# Patient Record
Sex: Male | Born: 1965 | Race: White | Hispanic: No | Marital: Married | Smoking: Former smoker
Health system: Southern US, Community
[De-identification: ages and names within clinical notes are randomized; demographics above are authoritative.]

## PROBLEM LIST (undated history)

## (undated) DIAGNOSIS — M545 Low back pain, unspecified: Secondary | ICD-10-CM

## (undated) DIAGNOSIS — F419 Anxiety disorder, unspecified: Secondary | ICD-10-CM

## (undated) DIAGNOSIS — E291 Testicular hypofunction: Secondary | ICD-10-CM

## (undated) DIAGNOSIS — E785 Hyperlipidemia, unspecified: Secondary | ICD-10-CM

## (undated) DIAGNOSIS — I10 Essential (primary) hypertension: Secondary | ICD-10-CM

## (undated) DIAGNOSIS — K219 Gastro-esophageal reflux disease without esophagitis: Secondary | ICD-10-CM

## (undated) DIAGNOSIS — K449 Diaphragmatic hernia without obstruction or gangrene: Secondary | ICD-10-CM

## (undated) DIAGNOSIS — S129XXA Fracture of neck, unspecified, initial encounter: Secondary | ICD-10-CM

## (undated) DIAGNOSIS — G4700 Insomnia, unspecified: Secondary | ICD-10-CM

## (undated) DIAGNOSIS — F329 Major depressive disorder, single episode, unspecified: Secondary | ICD-10-CM

## (undated) DIAGNOSIS — F32A Depression, unspecified: Secondary | ICD-10-CM

## (undated) DIAGNOSIS — E119 Type 2 diabetes mellitus without complications: Secondary | ICD-10-CM

## (undated) HISTORY — DX: Testicular hypofunction: E29.1

## (undated) HISTORY — DX: Diaphragmatic hernia without obstruction or gangrene: K44.9

## (undated) HISTORY — DX: Insomnia, unspecified: G47.00

## (undated) HISTORY — DX: Hyperlipidemia, unspecified: E78.5

## (undated) HISTORY — DX: Essential (primary) hypertension: I10

## (undated) HISTORY — DX: Gastro-esophageal reflux disease without esophagitis: K21.9

## (undated) HISTORY — DX: Anxiety disorder, unspecified: F41.9

## (undated) HISTORY — DX: Low back pain, unspecified: M54.50

## (undated) HISTORY — DX: Major depressive disorder, single episode, unspecified: F32.9

## (undated) HISTORY — PX: WISDOM TOOTH EXTRACTION: SHX21

## (undated) HISTORY — PX: CYST EXCISION: SHX5701

## (undated) HISTORY — DX: Depression, unspecified: F32.A

## (undated) HISTORY — DX: Type 2 diabetes mellitus without complications: E11.9

## (undated) HISTORY — DX: Low back pain: M54.5

## (undated) HISTORY — DX: Fracture of neck, unspecified, initial encounter: S12.9XXA

---

## 2017-03-25 ENCOUNTER — Ambulatory Visit (INDEPENDENT_AMBULATORY_CARE_PROVIDER_SITE_OTHER): Payer: BLUE CROSS/BLUE SHIELD | Admitting: Psychology

## 2017-03-25 DIAGNOSIS — F4323 Adjustment disorder with mixed anxiety and depressed mood: Secondary | ICD-10-CM | POA: Diagnosis not present

## 2017-03-26 ENCOUNTER — Other Ambulatory Visit: Payer: Self-pay | Admitting: Internal Medicine

## 2017-03-26 DIAGNOSIS — R109 Unspecified abdominal pain: Secondary | ICD-10-CM

## 2017-03-26 DIAGNOSIS — R509 Fever, unspecified: Secondary | ICD-10-CM

## 2017-03-26 DIAGNOSIS — R630 Anorexia: Secondary | ICD-10-CM

## 2017-03-27 ENCOUNTER — Ambulatory Visit
Admission: RE | Admit: 2017-03-27 | Discharge: 2017-03-27 | Disposition: A | Discharge status: Home / Self Care | Payer: BLUE CROSS/BLUE SHIELD | Source: Ambulatory Visit | Attending: Internal Medicine | Admitting: Internal Medicine

## 2017-03-27 DIAGNOSIS — R630 Anorexia: Secondary | ICD-10-CM

## 2017-03-27 DIAGNOSIS — R109 Unspecified abdominal pain: Secondary | ICD-10-CM

## 2017-03-27 DIAGNOSIS — R509 Fever, unspecified: Secondary | ICD-10-CM

## 2017-03-27 MED ORDER — IOPAMIDOL (ISOVUE-300) INJECTION 61%
125.0000 mL | Freq: Once | INTRAVENOUS | Status: AC | PRN
  Administered 2017-03-27: 125 mL via INTRAVENOUS

## 2017-04-18 ENCOUNTER — Ambulatory Visit (INDEPENDENT_AMBULATORY_CARE_PROVIDER_SITE_OTHER): Payer: BLUE CROSS/BLUE SHIELD | Admitting: Psychology

## 2017-04-18 DIAGNOSIS — F4323 Adjustment disorder with mixed anxiety and depressed mood: Secondary | ICD-10-CM

## 2017-05-16 ENCOUNTER — Ambulatory Visit (INDEPENDENT_AMBULATORY_CARE_PROVIDER_SITE_OTHER): Payer: BLUE CROSS/BLUE SHIELD | Admitting: Psychology

## 2017-05-16 DIAGNOSIS — F4323 Adjustment disorder with mixed anxiety and depressed mood: Secondary | ICD-10-CM | POA: Diagnosis not present

## 2017-06-19 ENCOUNTER — Ambulatory Visit (INDEPENDENT_AMBULATORY_CARE_PROVIDER_SITE_OTHER): Payer: BLUE CROSS/BLUE SHIELD | Admitting: Psychology

## 2017-06-19 DIAGNOSIS — F4323 Adjustment disorder with mixed anxiety and depressed mood: Secondary | ICD-10-CM

## 2017-07-03 ENCOUNTER — Institutional Professional Consult (permissible substitution): Payer: Self-pay | Admitting: Neurology

## 2017-07-18 ENCOUNTER — Ambulatory Visit (INDEPENDENT_AMBULATORY_CARE_PROVIDER_SITE_OTHER): Payer: BLUE CROSS/BLUE SHIELD | Admitting: Psychology

## 2017-07-18 DIAGNOSIS — F4323 Adjustment disorder with mixed anxiety and depressed mood: Secondary | ICD-10-CM | POA: Diagnosis not present

## 2017-07-31 ENCOUNTER — Encounter: Payer: Self-pay | Admitting: Neurology

## 2017-07-31 ENCOUNTER — Ambulatory Visit (INDEPENDENT_AMBULATORY_CARE_PROVIDER_SITE_OTHER): Payer: BLUE CROSS/BLUE SHIELD | Admitting: Neurology

## 2017-07-31 VITALS — BP 152/83 | HR 79 | Ht 74.0 in | Wt 224.0 lb

## 2017-07-31 DIAGNOSIS — E1169 Type 2 diabetes mellitus with other specified complication: Secondary | ICD-10-CM

## 2017-07-31 DIAGNOSIS — E669 Obesity, unspecified: Secondary | ICD-10-CM | POA: Diagnosis not present

## 2017-07-31 DIAGNOSIS — R51 Headache: Secondary | ICD-10-CM

## 2017-07-31 DIAGNOSIS — R609 Edema, unspecified: Secondary | ICD-10-CM | POA: Diagnosis not present

## 2017-07-31 DIAGNOSIS — G4719 Other hypersomnia: Secondary | ICD-10-CM

## 2017-07-31 DIAGNOSIS — G478 Other sleep disorders: Secondary | ICD-10-CM

## 2017-07-31 DIAGNOSIS — G479 Sleep disorder, unspecified: Secondary | ICD-10-CM

## 2017-07-31 DIAGNOSIS — R519 Headache, unspecified: Secondary | ICD-10-CM

## 2017-07-31 NOTE — Progress Notes (Signed)
Subjective:    Patient ID: Matthew Erickson. is a 51 y.o. male.  HPI     Matthew Foley, MD, PhD [facility] [address] [address] [place], [place] [address]  Dear Dr. Eloise Harman,   I saw your patient, Matthew Erickson, upon your kind request in my neurologic clinic today for initial consultation of his sleep disturbance, including difficulty with sleep maintenance.  The patient is unaccompanied today. As you know, Matthew Erickson is a 51 year old right-handed gentleman with an underlying medical history of diabetes, hypertension, low back pain, remote history of smoking and overweight state, who reports sleep disruption, daytime sleepiness, morning headaches.  He has difficulty shutting off his mind at night. He is a Product/process development scientist. His Epworth sleepiness score is 11 out of 24, fatigue score is 35 out of 63. He has nocturia occasionally, better since he has been on insulin. He has had morning headaches. He has been diabetic for about 5 years, on insulin for the past year. Diabetes control was suboptimal in the past, A1c is getting better, last A1c in July was 7.7. He quit smoking in 2001. He drinks alcohol infrequently in the form of beer, 2-4 per month, he does drink quite a bit of caffeine in the form of tea which is half and half, suite and unsweet, 4-6 glasses per day. He has 3 children. He lives with his wife and 79 year old daughter. He works as a Radio broadcast assistant. He has no family history of obstructive sleep apnea. He is not sure if he snores. He does not typically sleep in the same bedroom with his wife, often sleeps in his recliner in the family room with the TV on. I reviewed your office note from 06/20/2017, which you kindly included. Of note, some 6 months ago or so he tried Ambien 5 mg strength once and had daytime grogginess from it and has not tried it since then.  His Past Medical History Is Significant For: Past Medical History:  Diagnosis Date  . Anxiety and  depression   . Cervical spine fracture (HCC)   . Diabetes mellitus without complication (HCC)   . Hiatal hernia with gastroesophageal reflux   . Hyperlipidemia   . Hypertension   . Hypogonadism in male   . Insomnia   . Low back pain     His Past Surgical History Is Significant For: Past Surgical History:  Procedure Laterality Date  . CYST EXCISION    . WISDOM TOOTH EXTRACTION      His Family History Is Significant For: Family History  Problem Relation Age of Onset  . Diabetes Mother   . CAD Mother   . Diabetes Father   . Schizophrenia Father     His Social History Is Significant For: Social History   Social History  . Marital status: Married    Spouse name: N/A  . Number of children: N/A  . Years of education: N/A   Social History Main Topics  . Smoking status: Former Smoker    Quit date: 09/25/2000  . Smokeless tobacco: Never Used  . Alcohol use Yes  . Drug use: No  . Sexual activity: Not Asked   Other Topics Concern  . None   Social History Narrative   Drinks tea 4-6 glasses per day.    His Allergies Are:  Allergies  Allergen Reactions  . Codeine   :   His Current Medications Are:  Outpatient Encounter Prescriptions as of 07/31/2017  Medication Sig  . Ascorbic  Acid (VITAMIN C) 100 MG tablet Take 100 mg by mouth daily.  Marland Kitchen aspirin EC 81 MG tablet Take 81 mg by mouth daily.  . canagliflozin (INVOKANA) 100 MG TABS tablet Take 100 mg by mouth daily before breakfast.  . Cholecalciferol (VITAMIN D-3) 1000 units CAPS Take 1,000 Units by mouth.  . Cinnamon 500 MG TABS Take by mouth.  Marland Kitchen glimepiride (AMARYL) 4 MG tablet Take 4 mg by mouth daily with breakfast.  . insulin NPH Human (HUMULIN N,NOVOLIN N) 100 UNIT/ML injection Inject into the skin 2 (two) times daily before a meal. 10 units in am and 14 units in pm  . insulin regular (NOVOLIN R,HUMULIN R) 100 units/mL injection Inject 12 Units into the skin every evening.  Marland Kitchen losartan (COZAAR) 100 MG tablet Take  100 mg by mouth daily.  Marland Kitchen lovastatin (MEVACOR) 20 MG tablet Take 20 mg by mouth at bedtime.  . meloxicam (MOBIC) 15 MG tablet Take 15 mg by mouth daily as needed for pain.  . metFORMIN (GLUCOPHAGE-XR) 500 MG 24 hr tablet Take 500 mg by mouth 2 (two) times daily.  . nebivolol (BYSTOLIC) 5 MG tablet Take 5 mg by mouth daily.  Marland Kitchen testosterone cypionate (DEPOTESTOTERONE CYPIONATE) 100 MG/ML injection Inject 200 mg into the muscle every 21 ( twenty-one) days. For IM use only  . [DISCONTINUED] amLODipine (NORVASC) 10 MG tablet Take 10 mg by mouth daily.  . [DISCONTINUED] propranolol (INDERAL) 10 MG tablet Take 10 mg by mouth daily as needed.  . [DISCONTINUED] tamsulosin (FLOMAX) 0.4 MG CAPS capsule Take 0.4 mg by mouth at bedtime.   No facility-administered encounter medications on file as of 07/31/2017.   :  Review of Systems:  Out of a complete 14 point review of systems, all are reviewed and negative with the exception of these symptoms as listed below:  Review of Systems  Neurological:       Pt presents today to discuss his sleep. Pt does not sleep well. Pt has never had a sleep study and does not know if he snores.  Epworth Sleepiness Scale 0= would never doze 1= slight chance of dozing 2= moderate chance of dozing 3= high chance of dozing  Sitting and reading: 2 Watching TV: 3 Sitting inactive in a public place (ex. Theater or meeting): 1 As a passenger in a car for an hour without a break: 1 Lying down to rest in the afternoon: 3 Sitting and talking to someone: 0 Sitting quietly after lunch (no alcohol): 1 In a car, while stopped in traffic: 0 Total: 11     Objective:  Neurological Exam  Physical Exam Physical Examination:   Vitals:   07/31/17 1608  BP: (!) 152/83  Pulse: 79    General Examination: The patient is a very pleasant 51 y.o. male in no acute distress. He appears well-developed and well-nourished and well groomed.   HEENT: Normocephalic, atraumatic, pupils  are equal, round and reactive to light and accommodation. Funduscopic exam is normal with sharp disc margins noted. Extraocular tracking is good without limitation to gaze excursion or nystagmus noted. Normal smooth pursuit is noted. Hearing is grossly intact, reports hearing loss on R. Face is symmetric with normal facial animation and normal facial sensation. Speech is clear with no dysarthria noted. There is no hypophonia. There is no lip, neck/head, jaw or voice tremor. Neck is supple with full range of passive and active motion. There are no carotid bruits on auscultation. Oropharynx exam reveals: mild mouth dryness, adequate dental  hygiene and moderate airway crowding, due to smaller airway entry, redundant soft palate, large uvula and tonsils of 1-2+ bilaterally. Mallampati is class II. Neck circumference is 16-3/4 inches. Tongue protrudes centrally and palate elevates symmetrically.   Chest: Clear to auscultation without wheezing, rhonchi or crackles noted.  Heart: S1+S2+0, regular and normal without murmurs, rubs or gallops noted.   Abdomen: Soft, non-tender and non-distended with normal bowel sounds appreciated on auscultation.  Extremities: There is trace pitting edema in the distal lower extremities bilaterally. Pedal pulses are intact.  Skin: Warm and dry without trophic changes noted. There are no varicose veins.  Musculoskeletal: exam reveals no obvious joint deformities, tenderness or joint swelling or erythema.   Neurologically:  Mental status: The patient is awake, alert and oriented in all 4 spheres. His immediate and remote memory, attention, language skills and fund of knowledge are appropriate. There is no evidence of aphasia, agnosia, apraxia or anomia. Speech is clear with normal prosody and enunciation. Thought process is linear. Mood is normal and affect is normal.  Cranial nerves II - XII are as described above under HEENT exam. In addition: shoulder shrug is normal with  equal shoulder height noted. Motor exam: Normal bulk, strength and tone is noted. There is no drift, tremor or rebound. Romberg is negative. Reflexes are 1+ in the upper extremities, trace in both knees and ankles. Fine motor skills and coordination: intact with normal finger taps, normal hand movements, normal rapid alternating patting, normal foot taps and normal foot agility.  Cerebellar testing: No dysmetria or intention tremor on finger to nose testing. Heel to shin is unremarkable bilaterally. There is no truncal or gait ataxia.  Sensory exam: intact to light touch in the upper and lower extremities.  Gait, station and balance: He stands easily. No veering to one side is noted. No leaning to one side is noted. Posture is age-appropriate and stance is narrow based. Gait shows normal stride length and normal pace. No problems turning are noted. Tandem walk is unremarkable.           Assessment and Plan:   In summary, Matthew Schellenberg. is a very pleasant 51 y.o.-year old male with an underlying medical history of diabetes, hypertension, low back pain, remote history of smoking and overweight state, who presents for evaluation of his sleep difficulty including sleep maintenance problems, morning headaches, nonrestorative sleep and daytime somnolence. While not telltale in his symptoms, his physical exam and history may suggest underlying obstructive sleep apnea. Of note, he does not endorse telltale restless leg symptoms but is a restless sleeper.  I had a long chat with the patient about my findings and the diagnosis of OSA, its prognosis and treatment options. We talked about medical treatments, surgical interventions and non-pharmacological approaches. I explained in particular the risks and ramifications of untreated moderate to severe OSA, especially with respect to developing cardiovascular disease down the Road, including congestive heart failure, difficult to treat hypertension, cardiac arrhythmias,  or stroke. Even type 2 diabetes has, in part, been linked to untreated OSA. Symptoms of untreated OSA include daytime sleepiness, memory problems, mood irritability and mood disorder such as depression and anxiety, lack of energy, as well as recurrent headaches, especially morning headaches. We talked about trying to maintain a healthy lifestyle in general, as well as the importance of weight control.  I recommended the following at this time: sleep study with potential positive airway pressure titration. (We will score hypopneas at 3%).   I explained the  sleep test procedure to the patient and also outlined possible treatment options of OSA, including the use of CPAP. He indicated that he would be willing to try CPAP if the need arises. I explained the importance of being compliant with PAP treatment, not only for insurance purposes but primarily to improve His symptoms, and for the patient's long term health benefit, including to reduce His cardiovascular risks. I answered all his questions today and the patient was in agreement. I would like to see him back after the sleep study is completed and encouraged him to call with any interim questions, concerns, problems or updates.   Thank you very much for allowing me to participate in the care of this nice patient. If I can be of any further assistance to you please do not hesitate to call me at [phone].  Sincerely,   Matthew FoleySaima Julien Berryman, MD, PhD

## 2017-07-31 NOTE — Patient Instructions (Signed)

## 2017-08-18 ENCOUNTER — Telehealth: Payer: Self-pay | Admitting: Neurology

## 2017-08-18 ENCOUNTER — Ambulatory Visit (INDEPENDENT_AMBULATORY_CARE_PROVIDER_SITE_OTHER): Payer: BLUE CROSS/BLUE SHIELD | Admitting: Psychology

## 2017-08-18 DIAGNOSIS — F4323 Adjustment disorder with mixed anxiety and depressed mood: Secondary | ICD-10-CM

## 2017-08-18 DIAGNOSIS — G4719 Other hypersomnia: Secondary | ICD-10-CM

## 2017-08-18 NOTE — Telephone Encounter (Signed)
[employer] denied Split sleep and approved HST.  Can I get an order for HST? °

## 2017-09-03 ENCOUNTER — Ambulatory Visit (INDEPENDENT_AMBULATORY_CARE_PROVIDER_SITE_OTHER): Payer: BLUE CROSS/BLUE SHIELD | Admitting: Neurology

## 2017-09-03 DIAGNOSIS — G4719 Other hypersomnia: Secondary | ICD-10-CM

## 2017-09-03 DIAGNOSIS — G4733 Obstructive sleep apnea (adult) (pediatric): Secondary | ICD-10-CM

## 2017-09-08 NOTE — Progress Notes (Signed)
Patient referred by Dr. Eloise Harman, seen by me on 07/31/17, HST on 09/03/17.   Please call and notify the patient that the recent home sleep test did not show any significant obstructive sleep apnea.  Please remind patient to try to maintain good sleep hygiene, which means: Keep a regular sleep and wake schedule and make enough time for sleep (7 1/2 to 8 1/2 hours for the average adult), try not to exercise or have a meal within 2 hours of your bedtime, try to keep your bedroom conducive for sleep, that is, cool and dark, without light distractors such as an illuminated alarm clock, and refrain from watching TV right before sleep or in the middle of the night and do not keep the TV or radio on during the night. If a nightlight is used, have it away from the visual field. Also, try not to use or play on electronic devices at bedtime, such as your cell phone, tablet PC or laptop. If you like to read at bedtime on an electronic device, try to dim the background light as much as possible. Do not eat in the middle of the night. Keep pets away from the bedroom environment. For stress relief, try meditation, deep breathing exercises (there are many books and CDs available), a white noise machine or fan can help to diffuse other noise distractors, such as traffic noise. Do not drink alcohol before bedtime, as it can disturb sleep and cause middle of the night awakenings. Never mix alcohol and sedating medications! Avoid narcotic pain medication close to bedtime, as opioids/narcotics can suppress breathing drive and breathing effort.   Patient can follow up with his PCP.   A copy of the report will be sent to the patient, the PCP and referring MD, if other than PCP.  Once you have spoken to patient, you can close this encounter.   Thanks,  Huston Foley, MD, PhD [facility])

## 2017-09-08 NOTE — Procedures (Signed)
[facility] [address] [place], [place] [address] NAME: Matthew Erickson                                                         DOB: [birth year] MEDICAL RECORD NUMBER   [id number]                                    DOS: 09/03/17 REFERRING PHYSICIAN: Jarome Matin, MD STUDY PERFORMED: Home Sleep Test HISTORY: 51 year old man with a history of diabetes, hypertension, low back pain, remote history of smoking and overweight state, who reports sleep disruption, daytime sleepiness, morning headaches.  STUDY RESULTS: Total Recording: 8 Hours, 12 Minutes Total Apnea/Hypopnea Index (AHI):   3.8/Hour Average Oxygen Saturation:     94% Lowest Oxygen Saturation:      88%  Time Oxygen Saturation Below 88%: 0 min Average Heart Rate:   58 BPM IMPRESSION: Sleep disturbance RECOMMENDATION: This home sleep test does not demonstrate any significant obstructive or central sleep disordered breathing. Other causes of the patient's symptoms, including circadian rhythm disturbances, an underlying mood disorder, medication effect and/or an underlying medical problem cannot be ruled out based on this test. Clinical correlation is recommended. The patient should be cautioned not to drive, work at heights, or operate dangerous or heavy equipment when tired or sleepy. Review and reiteration of good sleep hygiene measures should be pursued with any patient. The patient and his referring provider will be notified of the test results; a follow up appointment will be made as necessary.  I certify that I have reviewed the raw data recording prior to the issuance of this report in accordance with the standards of the American Academy of Sleep Medicine (AASM). Huston Foley, MD, PhD [facility]) Diplomat, ABPN (Neurology and Sleep)

## 2017-09-09 ENCOUNTER — Telehealth: Payer: Self-pay

## 2017-09-09 NOTE — Telephone Encounter (Signed)
I called pt to discuss. No answer, left a message asking him to call me back. 

## 2017-09-09 NOTE — Telephone Encounter (Signed)
-----   Message from Huston Foley, MD sent at 09/08/2017  2:07 PM EDT ----- Patient referred by Dr. Eloise Harman, seen by me on 07/31/17, HST on 09/03/17.   Please call and notify the patient that the recent home sleep test did not show any significant obstructive sleep apnea.  Please remind patient to try to maintain good sleep hygiene, which means: Keep a regular sleep and wake schedule and make enough time for sleep (7 1/2 to 8 1/2 hours for the average adult), try not to exercise or have a meal within 2 hours of your bedtime, try to keep your bedroom conducive for sleep, that is, cool and dark, without light distractors such as an illuminated alarm clock, and refrain from watching TV right before sleep or in the middle of the night and do not keep the TV or radio on during the night. If a nightlight is used, have it away from the visual field. Also, try not to use or play on electronic devices at bedtime, such as your cell phone, tablet PC or laptop. If you like to read at bedtime on an electronic device, try to dim the background light as much as possible. Do not eat in the middle of the night. Keep pets away from the bedroom environment. For stress relief, try meditation, deep breathing exercises (there are many books and CDs available), a white noise machine or fan can help to diffuse other noise distractors, such as traffic noise. Do not drink alcohol before bedtime, as it can disturb sleep and cause middle of the night awakenings. Never mix alcohol and sedating medications! Avoid narcotic pain medication close to bedtime, as opioids/narcotics can suppress breathing drive and breathing effort.   Patient can follow up with his PCP.   A copy of the report will be sent to the patient, the PCP and referring MD, if other than PCP.  Once you have spoken to patient, you can close this encounter.   Thanks,  Huston Foley, MD, PhD [facility])

## 2017-09-10 NOTE — Telephone Encounter (Signed)
Pt returned RN's call °

## 2017-09-10 NOTE — Telephone Encounter (Signed)
I called pt. I advised pt that Dr. Frances FurbishAthar reviewed pt's sleep study and found that pt did not show any significant osa. Dr. Frances FurbishAthar recommends that pt follow up with Dr. Eloise HarmanPaterson. I reviewed sleep hygiene recommendations with the pt, including trying to keep a regular sleep wake schedule, avoiding electronics in the bedroom, keeping the bedroom cool, dark, and quiet, and avoiding eating or exercising within 2 hours of bedtime as well as eating in the middle of the night. I advised pt to keep pets away from the bedtime. I discussed with pt the importance of stress relief and to try meditation, deep breathing exercises, and/or a white noise machine or fan to diffuse other noise distracters. I advised pt to not drink alcohol before bedtime and to never mix alcohol and sedating medications. Pt was advised to avoid narcotic pain medication close to bedtime. I advised pt that a copy of these sleep study results will be sent to Dr. Eloise HarmanPaterson. Pt verbalized understanding of results. Pt had no questions at this time but was encouraged to call back if questions arise.

## 2017-09-22 ENCOUNTER — Ambulatory Visit: Payer: BLUE CROSS/BLUE SHIELD | Admitting: Psychology

## 2017-09-29 DIAGNOSIS — R972 Elevated prostate specific antigen [PSA]: Secondary | ICD-10-CM | POA: Diagnosis not present

## 2017-09-29 DIAGNOSIS — E1165 Type 2 diabetes mellitus with hyperglycemia: Secondary | ICD-10-CM | POA: Diagnosis not present

## 2017-09-29 DIAGNOSIS — E298 Other testicular dysfunction: Secondary | ICD-10-CM | POA: Diagnosis not present

## 2017-10-31 ENCOUNTER — Ambulatory Visit (INDEPENDENT_AMBULATORY_CARE_PROVIDER_SITE_OTHER): Payer: 59 | Admitting: Psychology

## 2017-10-31 DIAGNOSIS — F4323 Adjustment disorder with mixed anxiety and depressed mood: Secondary | ICD-10-CM

## 2017-11-05 DIAGNOSIS — M545 Low back pain: Secondary | ICD-10-CM | POA: Diagnosis not present

## 2017-11-05 DIAGNOSIS — M25552 Pain in left hip: Secondary | ICD-10-CM | POA: Diagnosis not present

## 2017-11-05 DIAGNOSIS — E1165 Type 2 diabetes mellitus with hyperglycemia: Secondary | ICD-10-CM | POA: Diagnosis not present

## 2017-11-06 DIAGNOSIS — M5432 Sciatica, left side: Secondary | ICD-10-CM | POA: Diagnosis not present

## 2017-11-06 DIAGNOSIS — M9903 Segmental and somatic dysfunction of lumbar region: Secondary | ICD-10-CM | POA: Diagnosis not present

## 2017-11-10 DIAGNOSIS — M5432 Sciatica, left side: Secondary | ICD-10-CM | POA: Diagnosis not present

## 2017-11-10 DIAGNOSIS — M9903 Segmental and somatic dysfunction of lumbar region: Secondary | ICD-10-CM | POA: Diagnosis not present

## 2017-11-11 DIAGNOSIS — M5432 Sciatica, left side: Secondary | ICD-10-CM | POA: Diagnosis not present

## 2017-11-11 DIAGNOSIS — M9903 Segmental and somatic dysfunction of lumbar region: Secondary | ICD-10-CM | POA: Diagnosis not present

## 2017-11-12 DIAGNOSIS — M5432 Sciatica, left side: Secondary | ICD-10-CM | POA: Diagnosis not present

## 2017-11-12 DIAGNOSIS — M9903 Segmental and somatic dysfunction of lumbar region: Secondary | ICD-10-CM | POA: Diagnosis not present

## 2017-11-13 DIAGNOSIS — M9903 Segmental and somatic dysfunction of lumbar region: Secondary | ICD-10-CM | POA: Diagnosis not present

## 2017-11-13 DIAGNOSIS — M5432 Sciatica, left side: Secondary | ICD-10-CM | POA: Diagnosis not present

## 2017-11-20 DIAGNOSIS — M9903 Segmental and somatic dysfunction of lumbar region: Secondary | ICD-10-CM | POA: Diagnosis not present

## 2017-11-20 DIAGNOSIS — M5432 Sciatica, left side: Secondary | ICD-10-CM | POA: Diagnosis not present

## 2017-11-24 DIAGNOSIS — M5432 Sciatica, left side: Secondary | ICD-10-CM | POA: Diagnosis not present

## 2017-11-24 DIAGNOSIS — M9903 Segmental and somatic dysfunction of lumbar region: Secondary | ICD-10-CM | POA: Diagnosis not present

## 2017-11-26 DIAGNOSIS — M5432 Sciatica, left side: Secondary | ICD-10-CM | POA: Diagnosis not present

## 2017-11-26 DIAGNOSIS — M9903 Segmental and somatic dysfunction of lumbar region: Secondary | ICD-10-CM | POA: Diagnosis not present

## 2017-11-27 DIAGNOSIS — M5432 Sciatica, left side: Secondary | ICD-10-CM | POA: Diagnosis not present

## 2017-11-27 DIAGNOSIS — M9903 Segmental and somatic dysfunction of lumbar region: Secondary | ICD-10-CM | POA: Diagnosis not present

## 2017-12-01 DIAGNOSIS — M5432 Sciatica, left side: Secondary | ICD-10-CM | POA: Diagnosis not present

## 2017-12-01 DIAGNOSIS — M9903 Segmental and somatic dysfunction of lumbar region: Secondary | ICD-10-CM | POA: Diagnosis not present

## 2017-12-04 DIAGNOSIS — M9903 Segmental and somatic dysfunction of lumbar region: Secondary | ICD-10-CM | POA: Diagnosis not present

## 2017-12-04 DIAGNOSIS — M5432 Sciatica, left side: Secondary | ICD-10-CM | POA: Diagnosis not present

## 2017-12-09 DIAGNOSIS — M5432 Sciatica, left side: Secondary | ICD-10-CM | POA: Diagnosis not present

## 2017-12-09 DIAGNOSIS — M9903 Segmental and somatic dysfunction of lumbar region: Secondary | ICD-10-CM | POA: Diagnosis not present

## 2017-12-10 DIAGNOSIS — M5432 Sciatica, left side: Secondary | ICD-10-CM | POA: Diagnosis not present

## 2017-12-10 DIAGNOSIS — M9903 Segmental and somatic dysfunction of lumbar region: Secondary | ICD-10-CM | POA: Diagnosis not present

## 2017-12-11 ENCOUNTER — Other Ambulatory Visit: Payer: Self-pay | Admitting: Internal Medicine

## 2017-12-11 DIAGNOSIS — M545 Low back pain: Secondary | ICD-10-CM

## 2017-12-12 ENCOUNTER — Ambulatory Visit: Payer: BLUE CROSS/BLUE SHIELD | Admitting: Psychology

## 2017-12-15 DIAGNOSIS — M9903 Segmental and somatic dysfunction of lumbar region: Secondary | ICD-10-CM | POA: Diagnosis not present

## 2017-12-15 DIAGNOSIS — M5432 Sciatica, left side: Secondary | ICD-10-CM | POA: Diagnosis not present

## 2017-12-17 DIAGNOSIS — M9903 Segmental and somatic dysfunction of lumbar region: Secondary | ICD-10-CM | POA: Diagnosis not present

## 2017-12-17 DIAGNOSIS — M5432 Sciatica, left side: Secondary | ICD-10-CM | POA: Diagnosis not present

## 2017-12-20 ENCOUNTER — Other Ambulatory Visit: Payer: Self-pay

## 2017-12-22 DIAGNOSIS — M9903 Segmental and somatic dysfunction of lumbar region: Secondary | ICD-10-CM | POA: Diagnosis not present

## 2017-12-22 DIAGNOSIS — M5432 Sciatica, left side: Secondary | ICD-10-CM | POA: Diagnosis not present

## 2017-12-24 DIAGNOSIS — M5432 Sciatica, left side: Secondary | ICD-10-CM | POA: Diagnosis not present

## 2017-12-24 DIAGNOSIS — M9903 Segmental and somatic dysfunction of lumbar region: Secondary | ICD-10-CM | POA: Diagnosis not present

## 2017-12-29 DIAGNOSIS — M9903 Segmental and somatic dysfunction of lumbar region: Secondary | ICD-10-CM | POA: Diagnosis not present

## 2017-12-29 DIAGNOSIS — M5432 Sciatica, left side: Secondary | ICD-10-CM | POA: Diagnosis not present

## 2017-12-30 DIAGNOSIS — E1165 Type 2 diabetes mellitus with hyperglycemia: Secondary | ICD-10-CM | POA: Diagnosis not present

## 2017-12-30 DIAGNOSIS — E298 Other testicular dysfunction: Secondary | ICD-10-CM | POA: Diagnosis not present

## 2017-12-31 DIAGNOSIS — M9903 Segmental and somatic dysfunction of lumbar region: Secondary | ICD-10-CM | POA: Diagnosis not present

## 2017-12-31 DIAGNOSIS — M5432 Sciatica, left side: Secondary | ICD-10-CM | POA: Diagnosis not present

## 2018-01-05 DIAGNOSIS — M9903 Segmental and somatic dysfunction of lumbar region: Secondary | ICD-10-CM | POA: Diagnosis not present

## 2018-01-05 DIAGNOSIS — M5432 Sciatica, left side: Secondary | ICD-10-CM | POA: Diagnosis not present

## 2018-01-06 ENCOUNTER — Ambulatory Visit (INDEPENDENT_AMBULATORY_CARE_PROVIDER_SITE_OTHER): Payer: 59 | Admitting: Physical Medicine and Rehabilitation

## 2018-01-06 ENCOUNTER — Encounter (INDEPENDENT_AMBULATORY_CARE_PROVIDER_SITE_OTHER): Payer: Self-pay | Admitting: Physical Medicine and Rehabilitation

## 2018-01-06 VITALS — BP 160/94 | HR 68 | Temp 97.9°F

## 2018-01-06 DIAGNOSIS — G8929 Other chronic pain: Secondary | ICD-10-CM | POA: Diagnosis not present

## 2018-01-06 DIAGNOSIS — M47816 Spondylosis without myelopathy or radiculopathy, lumbar region: Secondary | ICD-10-CM | POA: Diagnosis not present

## 2018-01-06 DIAGNOSIS — S39012D Strain of muscle, fascia and tendon of lower back, subsequent encounter: Secondary | ICD-10-CM | POA: Diagnosis not present

## 2018-01-06 DIAGNOSIS — M5442 Lumbago with sciatica, left side: Secondary | ICD-10-CM | POA: Diagnosis not present

## 2018-01-06 NOTE — Progress Notes (Signed)
Matthew Erickson. - 52 y.o. male MRN [id number]  Date of birth: [birth year]  Office Visit Note: Visit Date: 01/06/2018 PCP: [facility]erred by: [facility]  Subjective: Chief Complaint  Patient presents with  . Lower Back - Pain  . Left Leg - Pain   HPI: Matthew Erickson is a 52 year old gentleman who comes in today at the request of Dr. Jarome Matin for evaluation management of worsening low back pain and left radicular type leg pain.  Patient reports that he slipped in the snow in December of last year began having a lot of pain that originated in the left lower back and buttock region.  He reports initial use of anti-inflammatory rest and ice with no real relief.  He actually saw his chiropractor Dr. Manson Passey in [place].  He reports the chiropractor has helped him at least start walking straight to be more upright but he still has a considerable amount of pain on the left side.  He reports not liking to take medication and does not want to take pain medication but does take 1 Vicodin at night and it does help him sleep.  He reports no pain on the right side.  He reports worsening symptoms with coughing sneezing and moving the wrong way particularly with rotation shifting.  He also reports wearing a back brace helps with not moving the right way and seems to help him walk upright.  He really states nothing alleviates the pain.  He has had lumbar spine x-rays to Dr. Jarold Motto.  We have the report of that which basically shows fairly normal anatomic alignment with mild scoliosis and some disc height loss at L4-5.  Unfortunately we do not have those to review.  He has not had an MRI of her previous back surgery.  He has had no prior injections.  He has had pain off and on for many years with an episode in 2012 with a similar sciatica type symptoms.  He denies any focal weakness or bowel or bladder issues.  He has had no specific night pain or unintended weight loss.  He  has had a shot of Depo-Medrol which only helped for a few days.  He is a diabetic and it did raise his blood sugar a little bit.  He reports his diabetes is in good control.  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    Review of Systems  Constitutional: Negative for chills, fever, malaise/fatigue and weight loss.  HENT: Negative for hearing loss and sinus pain.   Eyes: Negative for blurred vision, double vision and photophobia.  Respiratory: Negative for cough and shortness of breath.   Cardiovascular: Negative for chest pain, palpitations and leg swelling.  Gastrointestinal: Negative for abdominal pain, nausea and vomiting.  Genitourinary: Negative for flank pain.  Musculoskeletal: Positive for back pain. Negative for myalgias.       Left hip and leg pain  Skin: Negative for itching and rash.  Neurological: Negative for tremors, focal weakness and weakness.  Endo/Heme/Allergies: Negative.   Psychiatric/Behavioral: Negative for depression.  All other systems reviewed and are negative.  Otherwise per HPI.  Assessment & Plan: Visit Diagnoses:  1. Strain of lumbar region, subsequent encounter   2. Chronic left-sided low back pain with left-sided sciatica   3. Spondylosis without myelopathy or radiculopathy, lumbar region     Plan: Findings:  History of chronic intermittent low back  pain and left radicular pain with last big episode of this in 2012 but now with increasing symptoms since December.  He really is failed conservative care with some anti-inflammatory medication as well as opioid medication chiropractic care.  His symptoms are consistent probably with small disc herniation or annular tear.  He is getting some radicular pain on the left and more of an L5 distribution.  At this point, he does have meloxicam at home that he was taking for occasional tension headache.  He was not taking that for his  lower back but was taking an intermittent mixture of as needed Naprosyn and ibuprofen.  I want him to take the meloxicam daily at least for a short while.  I also want him to schedule an epidural injection which would be at L5-S1 injection on the left.  We discussed this at length.  We discussed the natural history of strain of the lumbar region and disc herniations.  He has no red flag symptoms and does not really need an MRI currently.  He does not get any better with epidural injection I would suggest an MRI at that point.  Continue with his chiropractic care.  We talked about neural flossing.    Meds & Orders: No orders of the defined types were placed in this encounter.  No orders of the defined types were placed in this encounter.   Follow-up: Return for Left L5-S1 intralaminar epidural steroid injection.   Procedures: No procedures performed  No notes on file   Clinical History: No specialty comments available.  He reports that he quit smoking about 17 years ago. he has never used smokeless tobacco. No results for input(s): HGBA1C, LABURIC in the last 8760 hours.  Objective:  VS:  HT:    WT:   BMI:     BP:(!) 160/94  HR:68bpm  TEMP:97.9 F (36.6 C)(Oral)  RESP:97 % Physical Exam  Constitutional: He is oriented to person, place, and time. He appears well-developed and well-nourished. No distress.  HENT:  Head: Normocephalic and atraumatic.  Nose: Nose normal.  Mouth/Throat: Oropharynx is clear and moist.  Eyes: Conjunctivae are normal. Pupils are equal, round, and reactive to light.  Neck: Normal range of motion. Neck supple. No JVD present.  Cardiovascular: Regular rhythm and intact distal pulses.  Pulmonary/Chest: Effort normal and breath sounds normal.  Abdominal: Soft. He exhibits no distension. There is no guarding.  Musculoskeletal: He exhibits no deformity.  Patient is somewhat slow to arise from a seated does ambulate with an antalgic gait to the left.  He has no  pain with hip rotation internal or external.  He has some mild pain over the left greater trochanter more than the right.  He has an essentially positive slump test on the left but also just a lot of pain with flexion of the lumbar spine.  He has a lot of paraspinal tenderness and spasm.  He has good strength in the lower extremities without clonus.  Neurological: He is alert and oriented to person, place, and time. He exhibits normal muscle tone. Coordination normal.  Skin: Skin is warm. No rash noted.  Psychiatric: He has a normal mood and affect. His behavior is normal.  Nursing note and vitals reviewed.   Ortho Exam Imaging: No results found.  Past Medical/Family/Surgical/Social History: Medications & Allergies reviewed per EMR There are no active problems to display for this patient.  Past Medical History:  Diagnosis Date  . Anxiety and depression   . Cervical spine  fracture (HCC)   . Diabetes mellitus without complication (HCC)   . Hiatal hernia with gastroesophageal reflux   . Hyperlipidemia   . Hypertension   . Hypogonadism in male   . Insomnia   . Low back pain    Family History  Problem Relation Age of Onset  . Diabetes Mother   . CAD Mother   . Diabetes Father   . Schizophrenia Father    Past Surgical History:  Procedure Laterality Date  . CYST EXCISION    . WISDOM TOOTH EXTRACTION     Social History   Occupational History  . Not on file  Tobacco Use  . Smoking status: Former Smoker    Last attempt to quit: 09/25/2000    Years since quitting: 17.2  . Smokeless tobacco: Never Used  Substance and Sexual Activity  . Alcohol use: Yes  . Drug use: No  . Sexual activity: Not on file

## 2018-01-06 NOTE — Progress Notes (Deleted)
Pt states soreness in lower back mostly on the left side that radiates to the left leg. Pt states he slipped on the snow in December 2018. Pt states the pain has been going on for 2 months. Pt states coughing, sneezing, and moving the wrong way makes the pain worse, pt states nothing helps pain. Pt states using a back brace helps with not moving the wrong way.

## 2018-01-07 ENCOUNTER — Encounter (INDEPENDENT_AMBULATORY_CARE_PROVIDER_SITE_OTHER): Payer: Self-pay | Admitting: Physical Medicine and Rehabilitation

## 2018-01-14 DIAGNOSIS — M5432 Sciatica, left side: Secondary | ICD-10-CM | POA: Diagnosis not present

## 2018-01-14 DIAGNOSIS — M9903 Segmental and somatic dysfunction of lumbar region: Secondary | ICD-10-CM | POA: Diagnosis not present

## 2018-01-20 DIAGNOSIS — M9903 Segmental and somatic dysfunction of lumbar region: Secondary | ICD-10-CM | POA: Diagnosis not present

## 2018-01-20 DIAGNOSIS — M5432 Sciatica, left side: Secondary | ICD-10-CM | POA: Diagnosis not present

## 2018-01-21 ENCOUNTER — Ambulatory Visit (INDEPENDENT_AMBULATORY_CARE_PROVIDER_SITE_OTHER): Payer: 59 | Admitting: Psychology

## 2018-01-21 DIAGNOSIS — F4323 Adjustment disorder with mixed anxiety and depressed mood: Secondary | ICD-10-CM

## 2018-01-22 ENCOUNTER — Ambulatory Visit (INDEPENDENT_AMBULATORY_CARE_PROVIDER_SITE_OTHER): Payer: Self-pay

## 2018-01-22 ENCOUNTER — Encounter (INDEPENDENT_AMBULATORY_CARE_PROVIDER_SITE_OTHER): Payer: Self-pay | Admitting: Physical Medicine and Rehabilitation

## 2018-01-22 ENCOUNTER — Ambulatory Visit (INDEPENDENT_AMBULATORY_CARE_PROVIDER_SITE_OTHER): Payer: 59 | Admitting: Physical Medicine and Rehabilitation

## 2018-01-22 VITALS — BP 134/99 | HR 77 | Temp 98.2°F

## 2018-01-22 DIAGNOSIS — M5416 Radiculopathy, lumbar region: Secondary | ICD-10-CM

## 2018-01-22 MED ORDER — METHYLPREDNISOLONE ACETATE 80 MG/ML IJ SUSP
80.0000 mg | Freq: Once | INTRAMUSCULAR | Status: AC
  Administered 2018-01-22: 80 mg

## 2018-01-22 NOTE — Progress Notes (Deleted)
Pt states pain in lower back that radiates down through the left leg. Pt states pain started in December 2018. Pt states pain has gotten worse and he has not lifted anything. Pt states using ice sometimes makes pain better. +Driver, -BT, -Dye Allergies.

## 2018-01-22 NOTE — Patient Instructions (Signed)

## 2018-01-27 NOTE — Progress Notes (Signed)
Matthew SohoJames Schaber Jr. - 52 y.o. male MRN [id number]  Date of birth: [birth year]  Office Visit Note: Visit Date: 01/22/2018 PCP: [facility] Referred by: [facility]  Subjective: No chief complaint on file.  HPI: Mr. Matthew FoleyBrady is a 52 year old gentleman who comes in today for planned left L5-S1 interlaminar epidural steroid injection.  Please see our prior evaluation and management note for further details and justification.    ROS Otherwise per HPI.  Assessment & Plan: Visit Diagnoses:  1. Lumbar radiculopathy     Plan: Findings:  Diagnostic and therapeutic left L5-S1 intralaminar epidural steroid injection.    Meds & Orders:  Meds ordered this encounter  Medications  . methylPREDNISolone acetate (DEPO-MEDROL) injection 80 mg    Orders Placed This Encounter  Procedures  . XR C-ARM NO REPORT  . Epidural Steroid injection    Follow-up: Return if symptoms worsen or fail to improve.   Procedures: No procedures performed  Lumbar Epidural Steroid Injection - Interlaminar Approach with Fluoroscopic Guidance  Patient: Matthew SohoJames Blas Jr.      Date of Birth: [birth year] MRN: [id number] PCP: [facility]      Visit Date: 01/22/2018   Universal Protocol:     Consent Given By: the patient  Position: PRONE  Additional Comments: Vital signs were monitored before and after the procedure. Patient was prepped and draped in the usual sterile fashion. The correct patient, procedure, and site was verified.   Injection Procedure Details:  Procedure Site One Meds Administered:  Meds ordered this encounter  Medications  . methylPREDNISolone acetate (DEPO-MEDROL) injection 80 mg     Laterality: Left  Location/Site:  L5-S1  Needle size: 20 G  Needle type: Tuohy  Needle Placement: Paramedian epidural  Findings:   -Comments: Excellent flow of contrast into the epidural space.  Procedure Details: Using a paramedian approach  from the side mentioned above, the region overlying the inferior lamina was localized under fluoroscopic visualization and the soft tissues overlying this structure were infiltrated with 4 ml. of 1% Lidocaine without Epinephrine. The Tuohy needle was inserted into the epidural space using a paramedian approach.   The epidural space was localized using loss of resistance along with lateral and bi-planar fluoroscopic views.  After negative aspirate for air, blood, and CSF, a 2 ml. volume of Isovue-250 was injected into the epidural space and the flow of contrast was observed. Radiographs were obtained for documentation purposes.    The injectate was administered into the level noted above.   Additional Comments:  The patient tolerated the procedure well Dressing: Band-Aid    Post-procedure details: Patient was observed during the procedure. Post-procedure instructions were reviewed.  Patient left the clinic in stable condition.   Clinical History: No specialty comments available.  He reports that he quit smoking about 17 years ago. he has never used smokeless tobacco. No results for input(s): HGBA1C, LABURIC in the last 8760 hours.  Objective:  VS:  HT:    WT:   BMI:     BP:(!) 134/99  HR:77bpm  TEMP:98.2 F (36.8 C)( )  RESP:98 % Physical Exam  Ortho Exam Imaging: No results found.  Past Medical/Family/Surgical/Social History: Medications & Allergies reviewed per EMR There are no active problems to display for this patient.  Past Medical History:  Diagnosis Date  . Anxiety and depression   . Cervical spine fracture (HCC)   . Diabetes mellitus without complication (HCC)   . Hiatal hernia with gastroesophageal reflux   .  Hyperlipidemia   . Hypertension   . Hypogonadism in male   . Insomnia   . Low back pain    Family History  Problem Relation Age of Onset  . Diabetes Mother   . CAD Mother   . Diabetes Father   . Schizophrenia Father    Past Surgical History:    Procedure Laterality Date  . CYST EXCISION    . WISDOM TOOTH EXTRACTION     Social History   Occupational History  . Not on file  Tobacco Use  . Smoking status: Former Smoker    Last attempt to quit: 09/25/2000    Years since quitting: 17.3  . Smokeless tobacco: Never Used  Substance and Sexual Activity  . Alcohol use: Yes  . Drug use: No  . Sexual activity: Not on file

## 2018-01-27 NOTE — Procedures (Signed)
Lumbar Epidural Steroid Injection - Interlaminar Approach with Fluoroscopic Guidance  Patient: Matthew SohoJames Schulke Jr.      Date of Birth: Nov 27, 1965 MRN: [id number] PCP: [facility], [facility]      Visit Date: 01/22/2018   Universal Protocol:     Consent Given By: the patient  Position: PRONE  Additional Comments: Vital signs were monitored before and after the procedure. Patient was prepped and draped in the usual sterile fashion. The correct patient, procedure, and site was verified.   Injection Procedure Details:  Procedure Site One Meds Administered:  Meds ordered this encounter  Medications  . methylPREDNISolone acetate (DEPO-MEDROL) injection 80 mg     Laterality: Left  Location/Site:  L5-S1  Needle size: 20 G  Needle type: Tuohy  Needle Placement: Paramedian epidural  Findings:   -Comments: Excellent flow of contrast into the epidural space.  Procedure Details: Using a paramedian approach from the side mentioned above, the region overlying the inferior lamina was localized under fluoroscopic visualization and the soft tissues overlying this structure were infiltrated with 4 ml. of 1% Lidocaine without Epinephrine. The Tuohy needle was inserted into the epidural space using a paramedian approach.   The epidural space was localized using loss of resistance along with lateral and bi-planar fluoroscopic views.  After negative aspirate for air, blood, and CSF, a 2 ml. volume of Isovue-250 was injected into the epidural space and the flow of contrast was observed. Radiographs were obtained for documentation purposes.    The injectate was administered into the level noted above.   Additional Comments:  The patient tolerated the procedure well Dressing: Band-Aid    Post-procedure details: Patient was observed during the procedure. Post-procedure instructions were reviewed.  Patient left the clinic in stable condition.

## 2018-02-02 DIAGNOSIS — M9903 Segmental and somatic dysfunction of lumbar region: Secondary | ICD-10-CM | POA: Diagnosis not present

## 2018-02-02 DIAGNOSIS — M5432 Sciatica, left side: Secondary | ICD-10-CM | POA: Diagnosis not present

## 2018-02-27 ENCOUNTER — Ambulatory Visit (INDEPENDENT_AMBULATORY_CARE_PROVIDER_SITE_OTHER): Payer: 59 | Admitting: Psychology

## 2018-02-27 DIAGNOSIS — F4323 Adjustment disorder with mixed anxiety and depressed mood: Secondary | ICD-10-CM | POA: Diagnosis not present

## 2018-04-09 ENCOUNTER — Ambulatory Visit: Payer: 59 | Admitting: Psychology

## 2018-04-14 ENCOUNTER — Ambulatory Visit (INDEPENDENT_AMBULATORY_CARE_PROVIDER_SITE_OTHER): Payer: 59 | Admitting: Psychology

## 2018-04-14 DIAGNOSIS — F4323 Adjustment disorder with mixed anxiety and depressed mood: Secondary | ICD-10-CM

## 2018-04-27 DIAGNOSIS — I10 Essential (primary) hypertension: Secondary | ICD-10-CM | POA: Diagnosis not present

## 2018-04-27 DIAGNOSIS — E1165 Type 2 diabetes mellitus with hyperglycemia: Secondary | ICD-10-CM | POA: Diagnosis not present

## 2018-04-27 DIAGNOSIS — E291 Testicular hypofunction: Secondary | ICD-10-CM | POA: Diagnosis not present

## 2018-05-14 DIAGNOSIS — E1165 Type 2 diabetes mellitus with hyperglycemia: Secondary | ICD-10-CM | POA: Diagnosis not present

## 2018-05-14 DIAGNOSIS — I10 Essential (primary) hypertension: Secondary | ICD-10-CM | POA: Diagnosis not present

## 2018-05-14 DIAGNOSIS — Z794 Long term (current) use of insulin: Secondary | ICD-10-CM | POA: Diagnosis not present

## 2018-06-09 ENCOUNTER — Ambulatory Visit (INDEPENDENT_AMBULATORY_CARE_PROVIDER_SITE_OTHER): Payer: 59 | Admitting: Psychology

## 2018-06-09 DIAGNOSIS — F4323 Adjustment disorder with mixed anxiety and depressed mood: Secondary | ICD-10-CM

## 2018-06-11 DIAGNOSIS — E1165 Type 2 diabetes mellitus with hyperglycemia: Secondary | ICD-10-CM | POA: Diagnosis not present

## 2018-07-30 ENCOUNTER — Ambulatory Visit (INDEPENDENT_AMBULATORY_CARE_PROVIDER_SITE_OTHER): Payer: 59 | Admitting: Psychology

## 2018-07-30 DIAGNOSIS — F4323 Adjustment disorder with mixed anxiety and depressed mood: Secondary | ICD-10-CM | POA: Diagnosis not present

## 2018-08-21 DIAGNOSIS — Z125 Encounter for screening for malignant neoplasm of prostate: Secondary | ICD-10-CM | POA: Diagnosis not present

## 2018-08-21 DIAGNOSIS — Z0000 Encounter for general adult medical examination without abnormal findings: Secondary | ICD-10-CM | POA: Diagnosis not present

## 2018-08-21 DIAGNOSIS — R82998 Other abnormal findings in urine: Secondary | ICD-10-CM | POA: Diagnosis not present

## 2018-08-31 DIAGNOSIS — R05 Cough: Secondary | ICD-10-CM | POA: Diagnosis not present

## 2018-08-31 DIAGNOSIS — Z0000 Encounter for general adult medical examination without abnormal findings: Secondary | ICD-10-CM | POA: Diagnosis not present

## 2018-08-31 DIAGNOSIS — Z794 Long term (current) use of insulin: Secondary | ICD-10-CM | POA: Diagnosis not present

## 2018-08-31 DIAGNOSIS — E1165 Type 2 diabetes mellitus with hyperglycemia: Secondary | ICD-10-CM | POA: Diagnosis not present

## 2018-08-31 DIAGNOSIS — E291 Testicular hypofunction: Secondary | ICD-10-CM | POA: Diagnosis not present

## 2018-09-23 ENCOUNTER — Ambulatory Visit (INDEPENDENT_AMBULATORY_CARE_PROVIDER_SITE_OTHER): Payer: 59 | Admitting: Psychology

## 2018-09-23 DIAGNOSIS — F4323 Adjustment disorder with mixed anxiety and depressed mood: Secondary | ICD-10-CM

## 2018-11-16 ENCOUNTER — Ambulatory Visit: Payer: 59 | Admitting: Psychology

## 2018-12-29 DIAGNOSIS — E1165 Type 2 diabetes mellitus with hyperglycemia: Secondary | ICD-10-CM | POA: Diagnosis not present

## 2018-12-29 DIAGNOSIS — E291 Testicular hypofunction: Secondary | ICD-10-CM | POA: Diagnosis not present

## 2018-12-29 DIAGNOSIS — Z794 Long term (current) use of insulin: Secondary | ICD-10-CM | POA: Diagnosis not present

## 2019-01-08 ENCOUNTER — Ambulatory Visit (INDEPENDENT_AMBULATORY_CARE_PROVIDER_SITE_OTHER): Payer: 59 | Admitting: Psychology

## 2019-01-08 DIAGNOSIS — F4323 Adjustment disorder with mixed anxiety and depressed mood: Secondary | ICD-10-CM

## 2019-01-27 DIAGNOSIS — E1165 Type 2 diabetes mellitus with hyperglycemia: Secondary | ICD-10-CM | POA: Diagnosis not present

## 2019-01-27 DIAGNOSIS — Z794 Long term (current) use of insulin: Secondary | ICD-10-CM | POA: Diagnosis not present

## 2019-01-27 DIAGNOSIS — E291 Testicular hypofunction: Secondary | ICD-10-CM | POA: Diagnosis not present

## 2019-02-14 IMAGING — CT CT ABD-PELV W/ CM
3 of 5 series · 12 of 36 positions shown, 18 images · IV contrast (WATER & [ID] ISOVUE 300)
Comparison: Plain films 07/25/2009

CLINICAL DATA: Abdominal pain, decreased appetite, fever and chills
2 days ago. Currently on antibiotics. htn and diabetes. No just
urine, urine restriction, oliguria.

EXAM:
CT ABDOMEN AND PELVIS WITH CONTRAST
TECHNIQUE: Multidetector CT imaging of the abdomen and pelvis was performed
using the standard protocol following bolus administration of
intravenous contrast.
CONTRAST:  125mL H15GZG-077 IOPAMIDOL (H15GZG-077) INJECTION 61%

[Series 3: abd/pelvis with · axial · 0.85mm/px · z∈[-401,-76]mm · 7 of 87 slices shown, 12 images]
[im 11/87  soft-tissue]
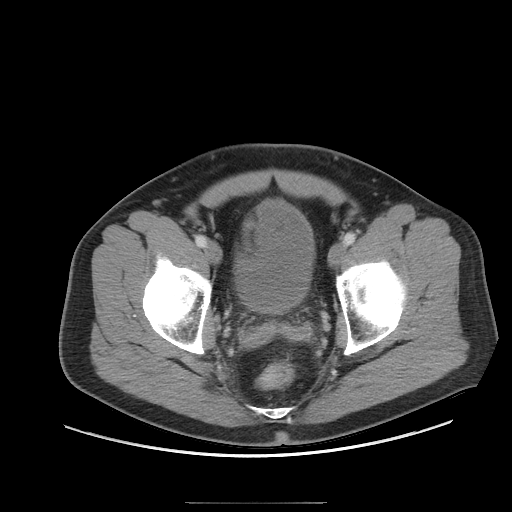
[im 11/87  bone]
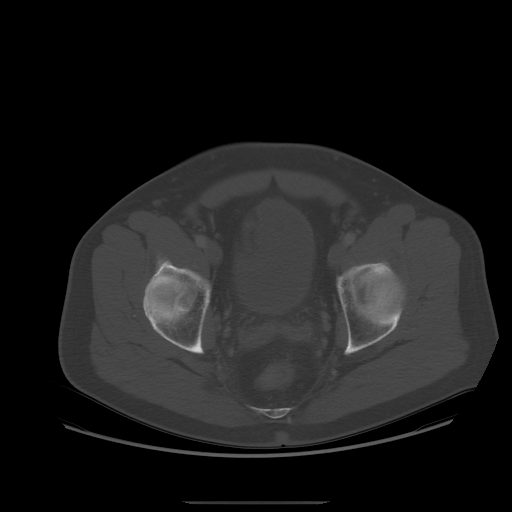
[im 22/87  soft-tissue]
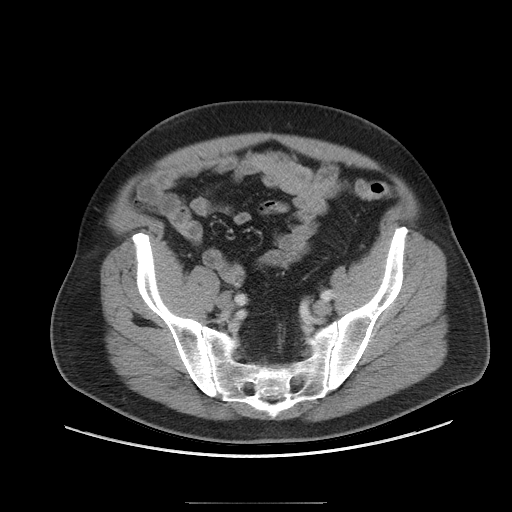
[im 33/87  soft-tissue]
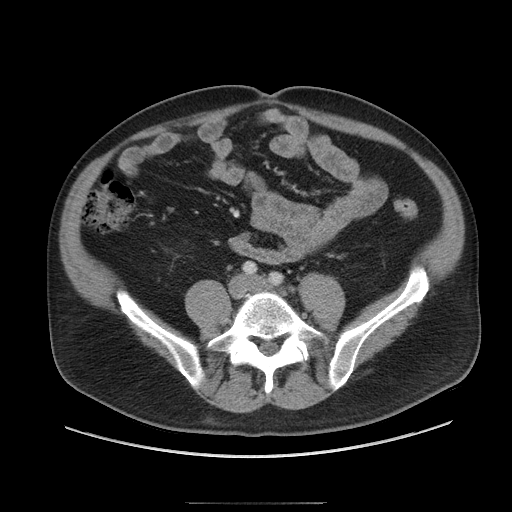
[im 44/87  soft-tissue]
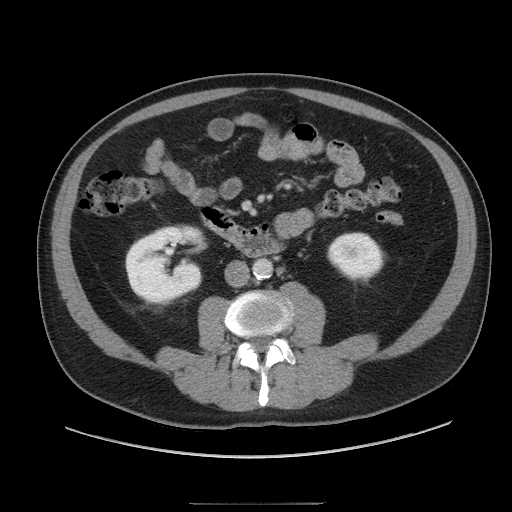
[im 44/87  lung]
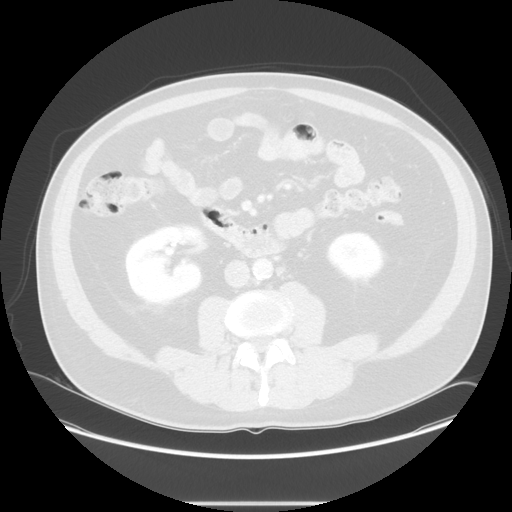
[im 54/87  soft-tissue]
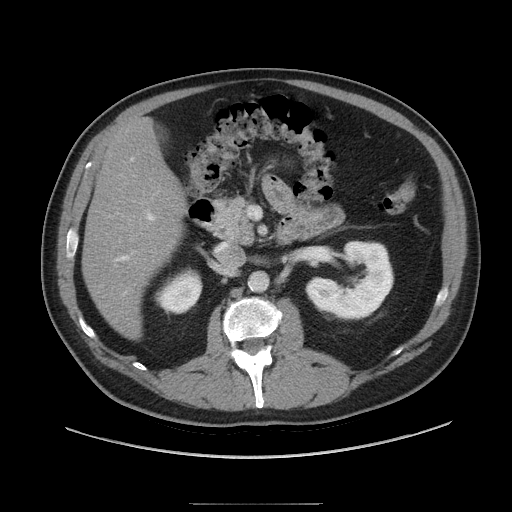
[im 54/87  lung]
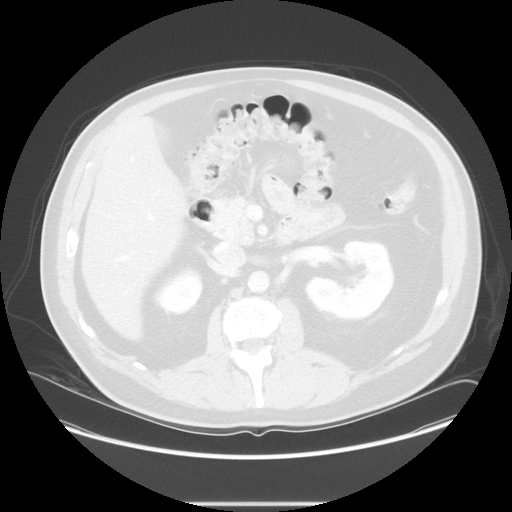
[im 65/87  soft-tissue]
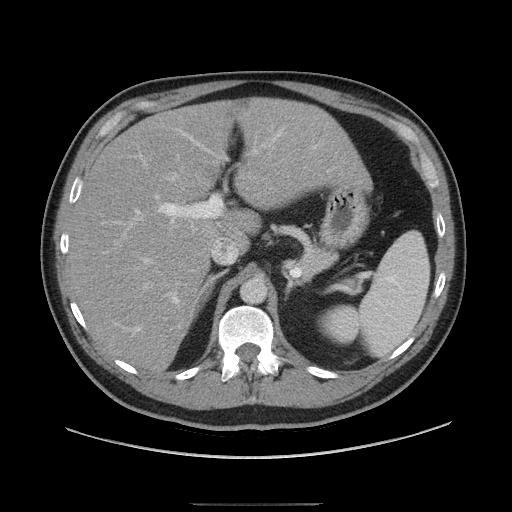
[im 65/87  lung]
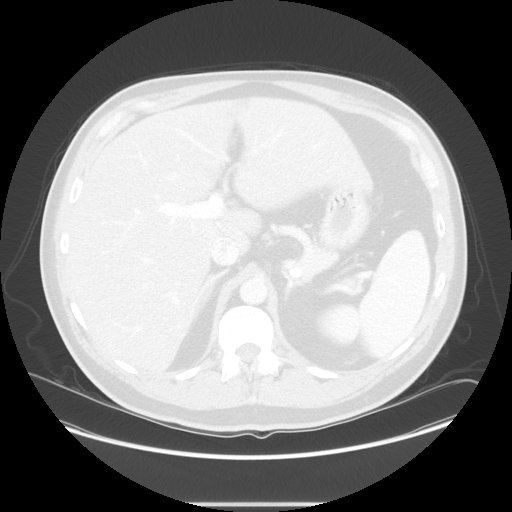
[im 76/87  soft-tissue]
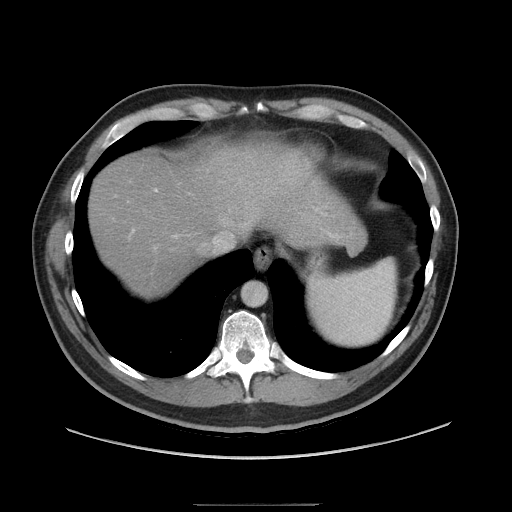
[im 76/87  lung]
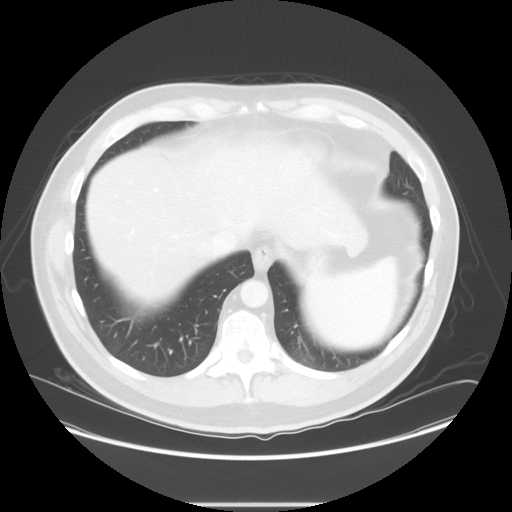

[Series 601: coronal body · coronal · 0.85mm/px · 1 of 131 slices shown, 2 images]
[im 44/131  soft-tissue]
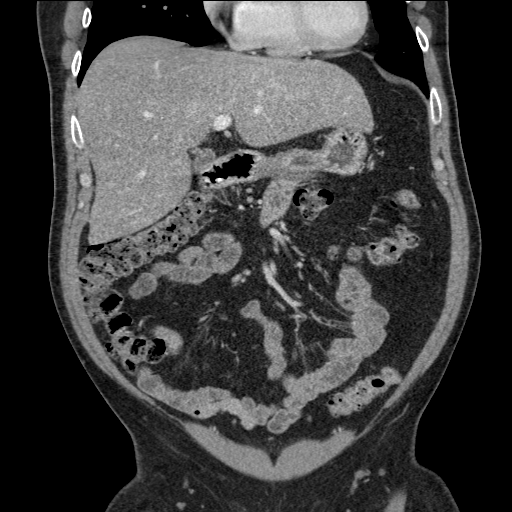
[im 44/131  bone]
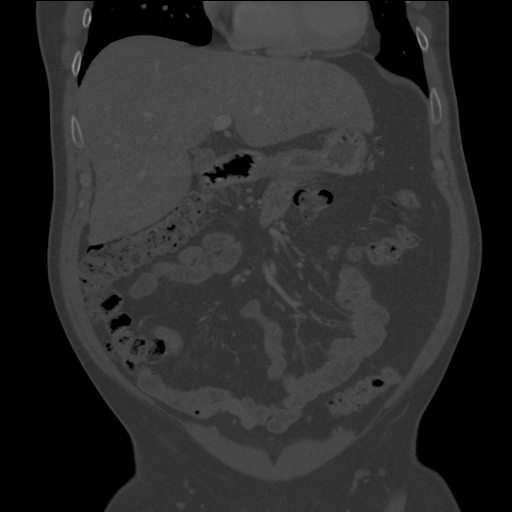

[Series 602: sagittal body · sagittal · 0.85mm/px · 4 of 166 slices shown]
[im 11/166  soft-tissue]
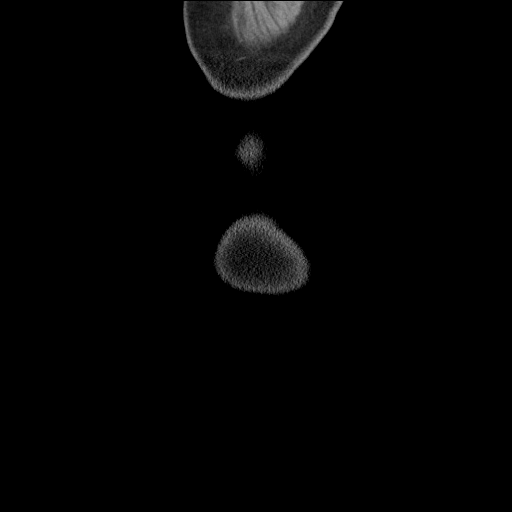
[im 31/166  soft-tissue]
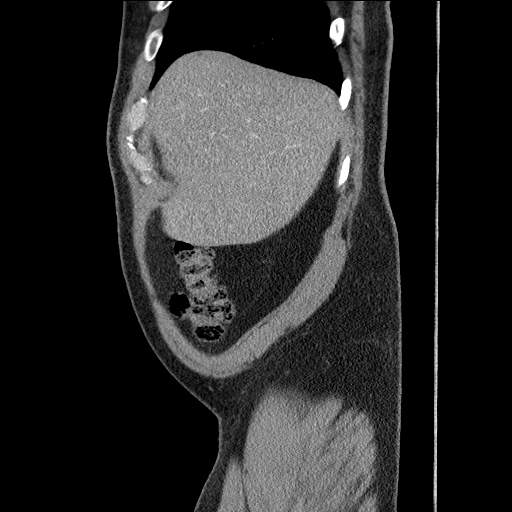
[im 52/166  soft-tissue]
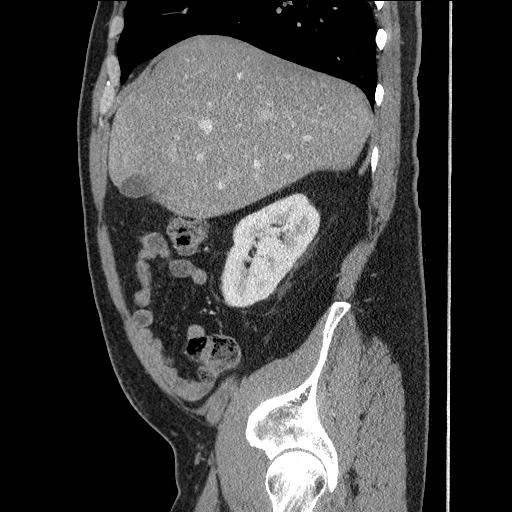
[im 73/166  soft-tissue]
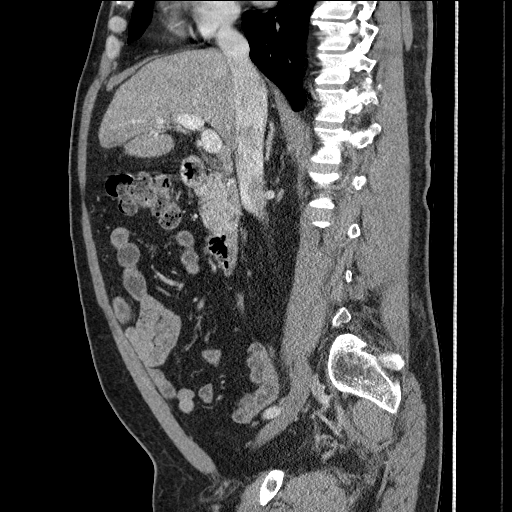

[12 of 36 positions shown; findings below may reference images not displayed]

FINDINGS: Lower chest: No acute abnormality.

Hepatobiliary: There is focal fatty infiltration adjacent to the
falciform ligament. The gallbladder is present and normal in
appearance. There are no suspicious liver lesions.

Pancreas: Unremarkable. No pancreatic ductal dilatation or
surrounding inflammatory changes.

Spleen: Normal in size without focal abnormality.

Adrenals/Urinary Tract: Normal adrenal glands. Exophytic cyst is
identified in the upper pole region of the left kidney measuring 1
cm. No hydronephrosis or suspicious renal mass. Normal urinary
bladder is normal in appearance.

Stomach/Bowel: The stomach and small bowel loops are normal in
appearance. The appendix is not well seen but there is no evidence
for acute appendicitis.

Vascular/Lymphatic: There is atherosclerotic calcification of the
abdominal aorta. Iliac arteries are also mildly calcified. The SMA,
IMA, and celiac axis are well opacified by contrast bolus. No
retroperitoneal or mesenteric adenopathy.

Reproductive: Prostate gland is mildly enlarged. There is mild
stranding in the region of the seminal vesicles, raising the
question of inflammation.

Other: There is no free pelvic fluid. Small amount of subcutaneous
gas is identified in the right anterior abdominal wall, likely
representing site of recent injection. Otherwise the abdominal wall
is unremarkable.

Musculoskeletal: No acute or significant osseous findings.
IMPRESSION: 1. Stranding in the region of the seminal vesicles. Prostate gland
is mildly enlarged. Findings raise a question of inflammation or
infection of the seminal vesicles and/or prostate.
2. Small left renal cyst.
3. No bowel obstruction.

## 2019-02-22 ENCOUNTER — Ambulatory Visit: Payer: 59 | Admitting: Psychology

## 2019-03-10 DIAGNOSIS — K219 Gastro-esophageal reflux disease without esophagitis: Secondary | ICD-10-CM | POA: Diagnosis not present

## 2019-03-10 DIAGNOSIS — E1165 Type 2 diabetes mellitus with hyperglycemia: Secondary | ICD-10-CM | POA: Diagnosis not present

## 2019-03-29 ENCOUNTER — Ambulatory Visit: Payer: Self-pay | Admitting: Psychology

## 2019-04-26 ENCOUNTER — Ambulatory Visit: Payer: Self-pay | Admitting: Psychology

## 2019-06-02 ENCOUNTER — Ambulatory Visit: Payer: Self-pay | Admitting: Psychology

## 2019-07-12 ENCOUNTER — Ambulatory Visit: Payer: Self-pay | Admitting: Psychology

## 2022-10-21 ENCOUNTER — Other Ambulatory Visit (HOSPITAL_COMMUNITY): Payer: Self-pay

## 2022-10-21 MED ORDER — OZEMPIC (1 MG/DOSE) 4 MG/3ML ~~LOC~~ SOPN
1.0000 mg | PEN_INJECTOR | SUBCUTANEOUS | 4 refills | Status: DC
  Filled 2022-10-21: qty 3, 28d supply, fill #0
  Filled 2022-11-26: qty 3, 28d supply, fill #1
  Filled 2023-01-08: qty 3, 28d supply, fill #2
  Filled 2023-02-07: qty 3, 28d supply, fill #3
  Filled 2023-03-21: qty 3, 28d supply, fill #4
  Filled 2023-04-28: qty 3, 28d supply, fill #5
  Filled 2023-06-02: qty 3, 28d supply, fill #6
  Filled 2023-07-04: qty 3, 28d supply, fill #7
  Filled 2023-08-08: qty 3, 28d supply, fill #8
  Filled 2023-09-15: qty 3, 28d supply, fill #9
  Filled 2023-10-21: qty 3, 28d supply, fill #10

## 2022-11-26 ENCOUNTER — Other Ambulatory Visit: Payer: Self-pay

## 2023-01-08 ENCOUNTER — Other Ambulatory Visit (HOSPITAL_COMMUNITY): Payer: Self-pay

## 2023-02-07 ENCOUNTER — Other Ambulatory Visit (HOSPITAL_COMMUNITY): Payer: Self-pay

## 2023-03-21 ENCOUNTER — Other Ambulatory Visit (HOSPITAL_COMMUNITY): Payer: Self-pay

## 2023-04-28 ENCOUNTER — Other Ambulatory Visit (HOSPITAL_COMMUNITY): Payer: Self-pay

## 2023-06-02 ENCOUNTER — Other Ambulatory Visit (HOSPITAL_COMMUNITY): Payer: Self-pay

## 2023-07-04 ENCOUNTER — Other Ambulatory Visit (HOSPITAL_COMMUNITY): Payer: Self-pay

## 2023-07-24 ENCOUNTER — Other Ambulatory Visit (HOSPITAL_COMMUNITY): Payer: Self-pay

## 2023-08-08 ENCOUNTER — Other Ambulatory Visit (HOSPITAL_COMMUNITY): Payer: Self-pay

## 2023-09-15 ENCOUNTER — Other Ambulatory Visit (HOSPITAL_COMMUNITY): Payer: Self-pay

## 2023-10-21 ENCOUNTER — Other Ambulatory Visit: Payer: Self-pay

## 2023-10-21 ENCOUNTER — Other Ambulatory Visit (HOSPITAL_COMMUNITY): Payer: Self-pay

## 2023-12-01 ENCOUNTER — Other Ambulatory Visit (HOSPITAL_COMMUNITY): Payer: Self-pay

## 2023-12-05 ENCOUNTER — Other Ambulatory Visit (HOSPITAL_COMMUNITY): Payer: Self-pay

## 2023-12-10 ENCOUNTER — Other Ambulatory Visit (HOSPITAL_COMMUNITY): Payer: Self-pay

## 2023-12-12 ENCOUNTER — Other Ambulatory Visit (HOSPITAL_COMMUNITY): Payer: Self-pay

## 2023-12-12 MED ORDER — OZEMPIC (1 MG/DOSE) 4 MG/3ML ~~LOC~~ SOPN
1.0000 mg | PEN_INJECTOR | SUBCUTANEOUS | 3 refills | Status: AC
  Filled 2023-12-12: qty 3, 28d supply, fill #0
  Filled 2024-01-22: qty 3, 28d supply, fill #1

## 2023-12-16 ENCOUNTER — Other Ambulatory Visit (HOSPITAL_COMMUNITY): Payer: Self-pay

## 2023-12-16 MED ORDER — OZEMPIC (1 MG/DOSE) 4 MG/3ML ~~LOC~~ SOPN
1.0000 mg | PEN_INJECTOR | SUBCUTANEOUS | 3 refills | Status: AC
  Filled 2023-12-16: qty 9, 84d supply, fill #0
  Filled 2024-02-24: qty 3, 28d supply, fill #0
  Filled 2024-04-15: qty 3, 28d supply, fill #1
  Filled 2024-06-02: qty 3, 28d supply, fill #2
  Filled 2024-07-09: qty 3, 28d supply, fill #3
  Filled 2024-08-27: qty 3, 28d supply, fill #4
  Filled 2024-10-15: qty 3, 28d supply, fill #5
  Filled 2024-12-10: qty 3, 28d supply, fill #6

## 2024-01-22 ENCOUNTER — Other Ambulatory Visit (HOSPITAL_COMMUNITY): Payer: Self-pay

## 2024-02-24 ENCOUNTER — Other Ambulatory Visit (HOSPITAL_COMMUNITY): Payer: Self-pay

## 2024-04-15 ENCOUNTER — Other Ambulatory Visit (HOSPITAL_COMMUNITY): Payer: Self-pay

## 2024-06-02 ENCOUNTER — Other Ambulatory Visit (HOSPITAL_COMMUNITY): Payer: Self-pay

## 2024-07-09 ENCOUNTER — Other Ambulatory Visit (HOSPITAL_COMMUNITY): Payer: Self-pay

## 2024-08-27 ENCOUNTER — Other Ambulatory Visit (HOSPITAL_COMMUNITY): Payer: Self-pay

## 2024-10-15 ENCOUNTER — Other Ambulatory Visit (HOSPITAL_COMMUNITY): Payer: Self-pay

## 2024-12-10 ENCOUNTER — Other Ambulatory Visit (HOSPITAL_COMMUNITY): Payer: Self-pay
